# Patient Record
Sex: Female | Born: 1980 | Race: White | Hispanic: No | Marital: Married | Smoking: Never smoker
Health system: Southern US, Community
[De-identification: ages and names within clinical notes are randomized; demographics above are authoritative.]

## PROBLEM LIST (undated history)

## (undated) HISTORY — PX: PLACEMENT OF BREAST IMPLANTS: SHX6334

## (undated) HISTORY — PX: KNEE SURGERY: SHX244

## (undated) HISTORY — PX: WISDOM TOOTH EXTRACTION: SHX21

---

## 2017-11-17 ENCOUNTER — Encounter: Payer: Self-pay | Admitting: Gynecology

## 2017-11-17 ENCOUNTER — Other Ambulatory Visit: Payer: Self-pay

## 2017-11-17 ENCOUNTER — Ambulatory Visit
Admission: EM | Admit: 2017-11-17 | Discharge: 2017-11-17 | Disposition: A | Discharge status: Home / Self Care | Payer: 59 | Attending: Emergency Medicine | Admitting: Emergency Medicine

## 2017-11-17 ENCOUNTER — Ambulatory Visit (INDEPENDENT_AMBULATORY_CARE_PROVIDER_SITE_OTHER): Payer: 59

## 2017-11-17 DIAGNOSIS — S6392XA Sprain of unspecified part of left wrist and hand, initial encounter: Secondary | ICD-10-CM

## 2017-11-17 DIAGNOSIS — W231XXA Caught, crushed, jammed, or pinched between stationary objects, initial encounter: Secondary | ICD-10-CM

## 2017-11-17 DIAGNOSIS — M79645 Pain in left finger(s): Secondary | ICD-10-CM | POA: Diagnosis not present

## 2017-11-17 MED ORDER — IBUPROFEN 600 MG PO TABS: 600.0000 mg | ORAL_TABLET | Freq: Four times a day (QID) | ORAL | 0 refills | Status: AC | PRN
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
## 2017-11-17 NOTE — Discharge Instructions (Addendum)
Continue ice.  Ace wrap as needed for comfort.  Take 1 gram of tylenol with 600 mg of motrin up to 3- 4 times a day as needed for pain.This with is an effective combination for pain.   Go to www.goodrx.com to look up your medications. This will give you a list of where you can find your prescriptions at the most affordable prices. Or ask the pharmacist what the cash price is, or if they have any other discount programs available to help make your medication more affordable. This can be less expensive than what you would pay with insurance.
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
## 2019-01-19 IMAGING — CR DG HAND COMPLETE 3+V*L*
3 series · 3 of 3 positions shown · non-contrast
Comparison: None.

CLINICAL DATA: Pain after hitting object

EXAM:
LEFT HAND - COMPLETE 3+ VIEW

[hand ap]
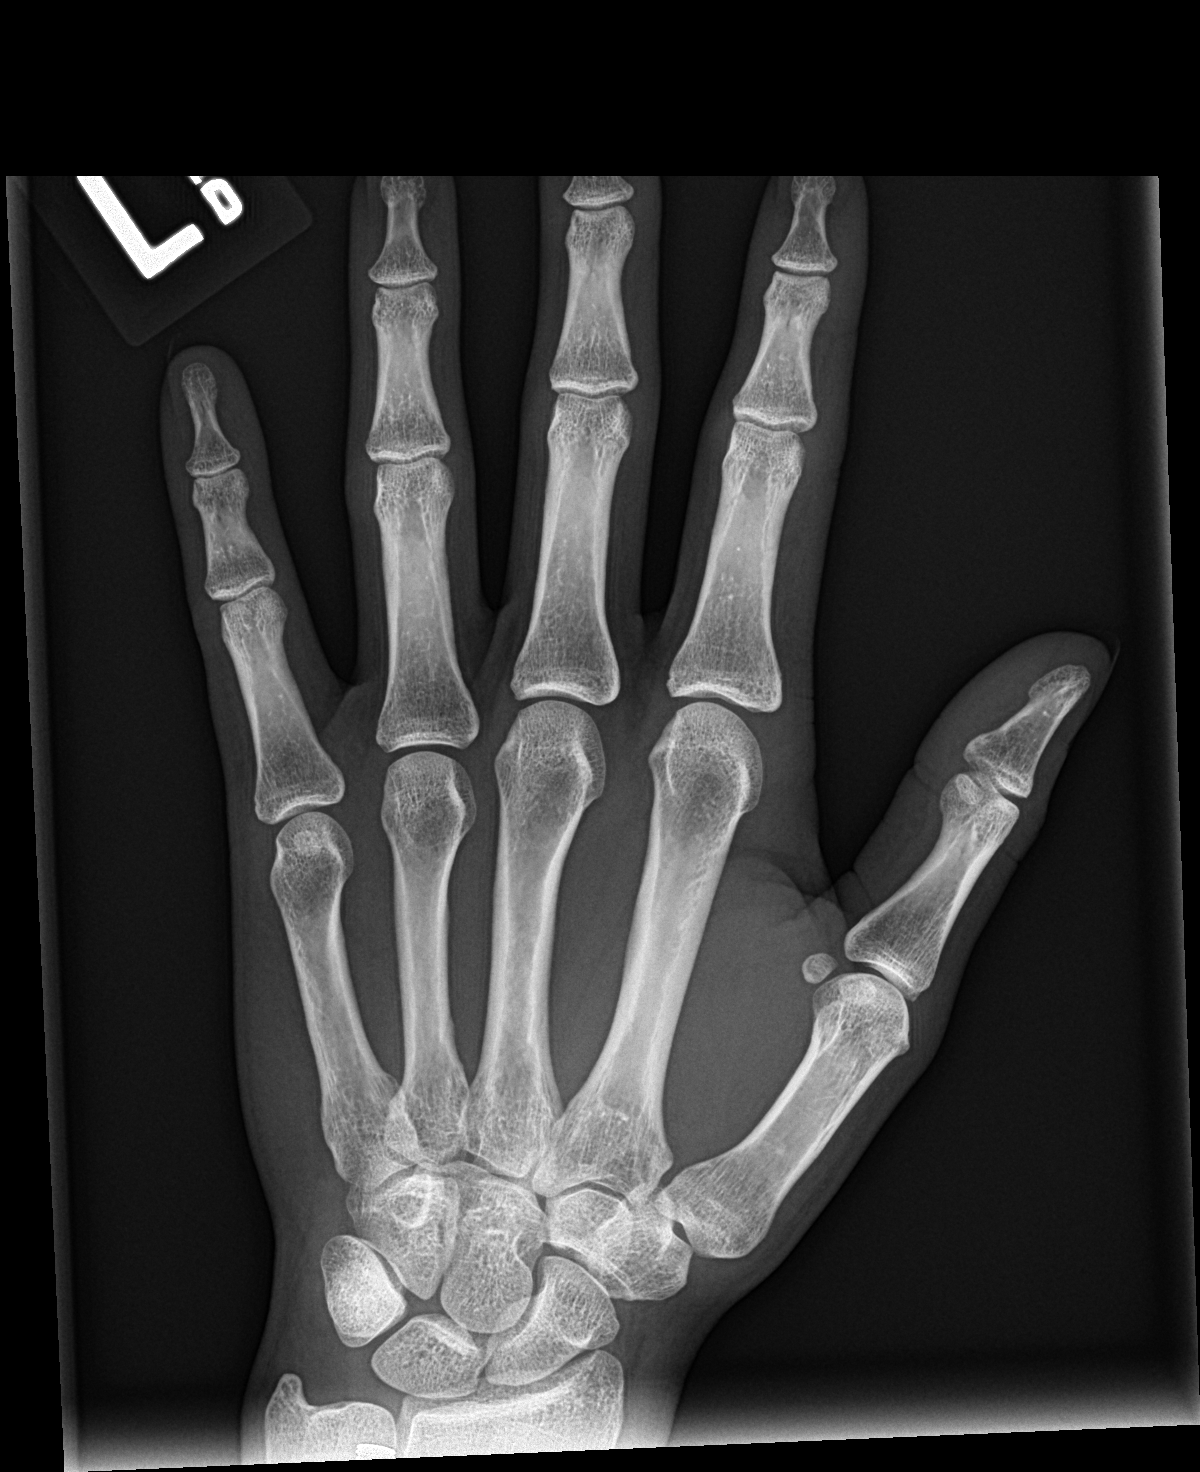

[hand obl]
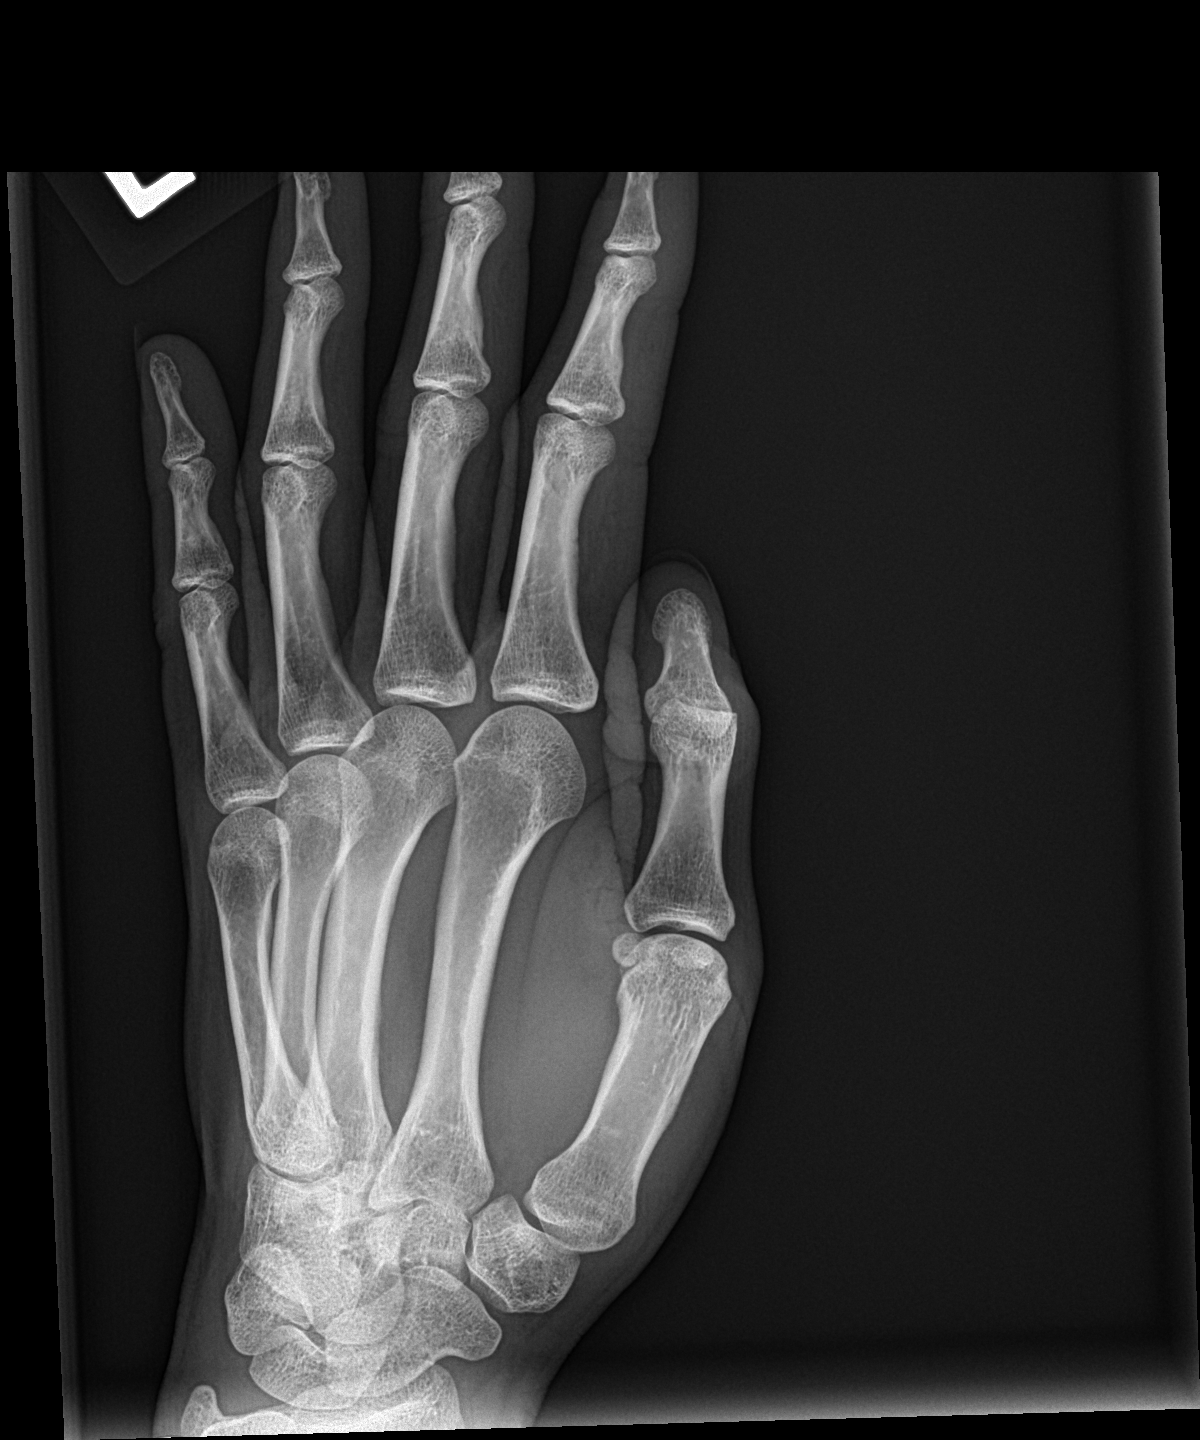

[hand lat]
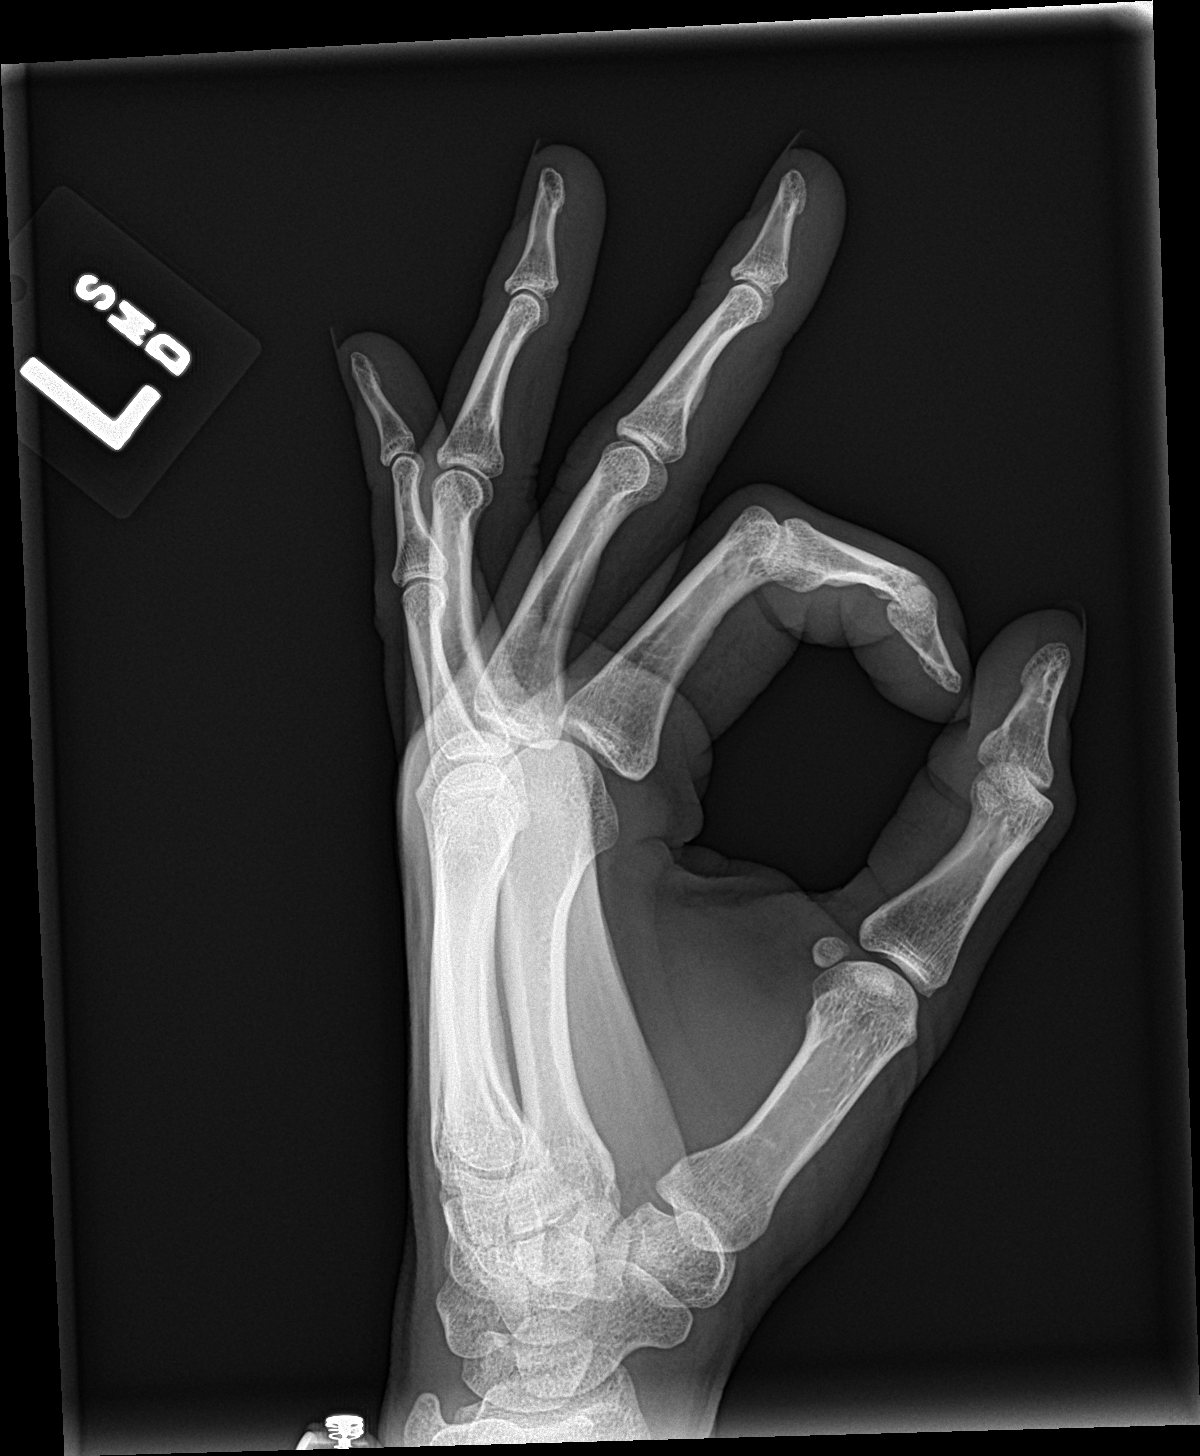

[3 of 3 positions shown; findings below may reference images not displayed]

FINDINGS: Frontal, oblique, and lateral views obtained. There is no fracture
or dislocation. Joint spaces appear normal. No erosive change.
IMPRESSION: No fracture or dislocation.  No apparent arthropathy.
